# Patient Record
Sex: Female | Born: 1985 | Race: Black or African American | Hispanic: No | Marital: Single | Smoking: Never smoker
Health system: Southern US, Community
[De-identification: ages and names within clinical notes are randomized; demographics above are authoritative.]

---

## 2006-08-16 ENCOUNTER — Emergency Department (HOSPITAL_COMMUNITY): Admission: EM | Admit: 2006-08-16 | Discharge: 2006-08-16 | Payer: Self-pay | Admitting: Emergency Medicine

## 2006-08-16 ENCOUNTER — Ambulatory Visit (HOSPITAL_COMMUNITY): Admission: RE | Admit: 2006-08-16 | Discharge: 2006-08-16 | Payer: Self-pay | Admitting: Emergency Medicine

## 2006-10-03 ENCOUNTER — Emergency Department (HOSPITAL_COMMUNITY): Admission: EM | Admit: 2006-10-03 | Discharge: 2006-10-03 | Payer: Self-pay | Admitting: Family Medicine

## 2006-10-29 ENCOUNTER — Emergency Department (HOSPITAL_COMMUNITY): Admission: EM | Admit: 2006-10-29 | Discharge: 2006-10-29 | Payer: Self-pay | Admitting: Family Medicine

## 2007-01-18 ENCOUNTER — Emergency Department (HOSPITAL_COMMUNITY): Admission: EM | Admit: 2007-01-18 | Discharge: 2007-01-18 | Payer: Self-pay | Admitting: Emergency Medicine

## 2007-01-31 ENCOUNTER — Inpatient Hospital Stay (HOSPITAL_COMMUNITY): Admission: AD | Admit: 2007-01-31 | Discharge: 2007-01-31 | Payer: Self-pay | Admitting: Family Medicine

## 2007-01-31 ENCOUNTER — Ambulatory Visit: Payer: Self-pay | Admitting: Family Medicine

## 2007-03-22 ENCOUNTER — Ambulatory Visit: Payer: Self-pay | Admitting: Certified Nurse Midwife

## 2007-03-22 ENCOUNTER — Inpatient Hospital Stay (HOSPITAL_COMMUNITY): Admission: AD | Admit: 2007-03-22 | Discharge: 2007-03-22 | Payer: Self-pay | Admitting: Obstetrics & Gynecology

## 2007-05-08 ENCOUNTER — Ambulatory Visit: Payer: Self-pay | Admitting: Obstetrics & Gynecology

## 2007-05-08 ENCOUNTER — Encounter: Payer: Self-pay | Admitting: Obstetrics and Gynecology

## 2007-05-22 ENCOUNTER — Ambulatory Visit: Payer: Self-pay | Admitting: Obstetrics & Gynecology

## 2007-06-12 ENCOUNTER — Ambulatory Visit: Payer: Self-pay | Admitting: Obstetrics & Gynecology

## 2007-06-19 ENCOUNTER — Ambulatory Visit: Payer: Self-pay | Admitting: Obstetrics & Gynecology

## 2007-06-24 ENCOUNTER — Inpatient Hospital Stay (HOSPITAL_COMMUNITY): Admission: AD | Admit: 2007-06-24 | Discharge: 2007-06-27 | Payer: Self-pay | Admitting: Family Medicine

## 2007-06-24 ENCOUNTER — Ambulatory Visit: Payer: Self-pay | Admitting: *Deleted

## 2007-06-27 ENCOUNTER — Inpatient Hospital Stay (HOSPITAL_COMMUNITY): Admission: AD | Admit: 2007-06-27 | Discharge: 2007-06-28 | Payer: Self-pay | Admitting: Obstetrics and Gynecology

## 2007-06-27 ENCOUNTER — Ambulatory Visit: Payer: Self-pay | Admitting: Obstetrics and Gynecology

## 2008-06-22 ENCOUNTER — Emergency Department (HOSPITAL_BASED_OUTPATIENT_CLINIC_OR_DEPARTMENT_OTHER): Admission: EM | Admit: 2008-06-22 | Discharge: 2008-06-22 | Payer: Self-pay | Admitting: Emergency Medicine

## 2010-03-28 ENCOUNTER — Emergency Department (HOSPITAL_BASED_OUTPATIENT_CLINIC_OR_DEPARTMENT_OTHER): Admission: EM | Admit: 2010-03-28 | Discharge: 2010-03-28 | Payer: Self-pay | Admitting: Emergency Medicine

## 2010-04-08 ENCOUNTER — Emergency Department (HOSPITAL_BASED_OUTPATIENT_CLINIC_OR_DEPARTMENT_OTHER): Admission: EM | Admit: 2010-04-08 | Discharge: 2010-04-08 | Payer: Self-pay | Admitting: Emergency Medicine

## 2011-01-08 LAB — PREGNANCY, URINE: Preg Test, Ur: NEGATIVE

## 2011-01-23 ENCOUNTER — Emergency Department (HOSPITAL_BASED_OUTPATIENT_CLINIC_OR_DEPARTMENT_OTHER)
Admission: EM | Admit: 2011-01-23 | Discharge: 2011-01-23 | Disposition: A | Discharge status: Home / Self Care | Payer: Medicaid Other | Attending: Emergency Medicine | Admitting: Emergency Medicine

## 2011-01-23 DIAGNOSIS — B354 Tinea corporis: Secondary | ICD-10-CM | POA: Insufficient documentation

## 2011-08-04 LAB — CBC
Hemoglobin: 13.1
MCHC: 34.3
Platelets: 168
RDW: 15.1 — ABNORMAL HIGH

## 2011-08-04 LAB — URINALYSIS, ROUTINE W REFLEX MICROSCOPIC
Glucose, UA: NEGATIVE
Ketones, ur: NEGATIVE
Leukocytes, UA: NEGATIVE
Protein, ur: NEGATIVE
Urobilinogen, UA: 1

## 2011-08-04 LAB — POCT URINALYSIS DIP (DEVICE)
Glucose, UA: NEGATIVE
Hgb urine dipstick: NEGATIVE
Ketones, ur: NEGATIVE
Operator id: 159681
Specific Gravity, Urine: 1.01

## 2011-08-07 LAB — POCT URINALYSIS DIP (DEVICE)
Bilirubin Urine: NEGATIVE
Glucose, UA: NEGATIVE
Glucose, UA: NEGATIVE
Hgb urine dipstick: NEGATIVE
Ketones, ur: NEGATIVE
Ketones, ur: NEGATIVE
Operator id: 11950
Operator id: 134861
Specific Gravity, Urine: 1.02
Specific Gravity, Urine: 1.02

## 2011-11-30 ENCOUNTER — Encounter (HOSPITAL_BASED_OUTPATIENT_CLINIC_OR_DEPARTMENT_OTHER): Payer: Self-pay | Admitting: *Deleted

## 2011-11-30 ENCOUNTER — Emergency Department (HOSPITAL_BASED_OUTPATIENT_CLINIC_OR_DEPARTMENT_OTHER)
Admission: EM | Admit: 2011-11-30 | Discharge: 2011-11-30 | Disposition: A | Discharge status: Home / Self Care | Payer: Self-pay | Attending: Emergency Medicine | Admitting: Emergency Medicine

## 2011-11-30 DIAGNOSIS — J3489 Other specified disorders of nose and nasal sinuses: Secondary | ICD-10-CM | POA: Insufficient documentation

## 2011-11-30 DIAGNOSIS — R51 Headache: Secondary | ICD-10-CM | POA: Insufficient documentation

## 2011-11-30 DIAGNOSIS — J329 Chronic sinusitis, unspecified: Secondary | ICD-10-CM | POA: Insufficient documentation

## 2011-11-30 MED ORDER — IBUPROFEN 800 MG PO TABS: 800.0000 mg | ORAL_TABLET | Freq: Three times a day (TID) | ORAL | Status: AC

## 2011-11-30 MED ORDER — PSEUDOEPHEDRINE HCL ER 120 MG PO TB12: 120.0000 mg | ORAL_TABLET | Freq: Two times a day (BID) | ORAL | Status: AC

## 2011-11-30 MED ORDER — IBUPROFEN 800 MG PO TABS
800.0000 mg | ORAL_TABLET | Freq: Once | ORAL | Status: AC
  Administered 2011-11-30: 800 mg via ORAL
  Filled 2011-11-30: qty 1

## 2011-11-30 NOTE — ED Provider Notes (Signed)
History     CSN: [id number]  Arrival date & time 11/30/11  1401   First MD Initiated Contact with Patient 11/30/11 1411      Chief Complaint  Patient presents with  . Nasal Congestion    (Consider location/radiation/quality/duration/timing/severity/associated sxs/prior treatment) Patient is a 26 y.o. female presenting with URI. The history is provided by the spouse.  URI The primary symptoms include headaches. Primary symptoms do not include fever, sore throat, cough, wheezing, nausea or vomiting. The current episode started 3 to 5 days ago. This is a new problem. The problem has not changed since onset. Symptoms associated with the illness include sinus pressure, congestion and rhinorrhea. The illness is not associated with plugged ear sensation. The following treatments were addressed: Acetaminophen was not tried. A decongestant was not tried. Aspirin was not tried. NSAIDs were not tried.   History reviewed. No pertinent past medical history.  History reviewed. No pertinent past surgical history.  History reviewed. No pertinent family history.  History  Substance Use Topics  . Smoking status: Never Smoker   . Smokeless tobacco: Not on file  . Alcohol Use: Yes    OB History    Grav Para Term Preterm Abortions TAB SAB Ect Mult Living                  Review of Systems  Constitutional: Negative for fever.  HENT: Positive for congestion, rhinorrhea and sinus pressure. Negative for sore throat.   Respiratory: Negative for cough and wheezing.   Gastrointestinal: Negative for nausea and vomiting.  Neurological: Positive for headaches.  All other systems reviewed and are negative.    Allergies  Review of patient's allergies indicates no known allergies.  Home Medications  No current outpatient prescriptions on file.  BP 113/67  Pulse 101  Temp(Src) 99.4 F (37.4 C) (Oral)  Resp 20  SpO2 99%  Physical Exam  Nursing note and vitals reviewed. Constitutional: She  appears well-developed and well-nourished. No distress.  HENT:  Head: Normocephalic and atraumatic.  Right Ear: External ear normal.  Left Ear: External ear normal.  Nose: Mucosal edema and rhinorrhea present. No nose lacerations, sinus tenderness or nasal deformity. Right sinus exhibits no maxillary sinus tenderness and no frontal sinus tenderness. Left sinus exhibits no maxillary sinus tenderness and no frontal sinus tenderness.  Mouth/Throat: Uvula is midline, oropharynx is clear and moist and mucous membranes are normal.  Eyes: Conjunctivae are normal. Right eye exhibits no discharge. Left eye exhibits no discharge. No scleral icterus.  Neck: Neck supple. No tracheal deviation present.  Cardiovascular: Normal rate, regular rhythm and normal heart sounds.   Pulmonary/Chest: Effort normal and breath sounds normal. No stridor. No respiratory distress. She has no wheezes. She has no rales.  Musculoskeletal: She exhibits no edema.  Neurological: She is alert. Cranial nerve deficit: no gross deficits.  Skin: Skin is warm and dry. No rash noted.  Psychiatric: She has a normal mood and affect.    ED Course  Procedures (including critical care time)  Labs Reviewed - No data to display No results found.   1. Rhinosinusitis       MDM  Symptoms are most likely related to a viral infection. Her symptoms are not suggestive of a bacterial sinusitis. Patient be treated with decongestants and NSAIDs. Patient has been instructed to return for worsening symptoms fever or other concerns        Celene Kras, MD 11/30/11 [phone]

## 2011-11-30 NOTE — ED Notes (Signed)
Pt amb to room 6 with quick steady gait in nad. Reports sinus congestion and drainage with sneezing x Monday.

## 2014-09-25 ENCOUNTER — Encounter (HOSPITAL_COMMUNITY): Payer: Self-pay | Admitting: *Deleted

## 2014-09-25 ENCOUNTER — Emergency Department (HOSPITAL_COMMUNITY)
Admission: EM | Admit: 2014-09-25 | Discharge: 2014-09-25 | Disposition: A | Discharge status: Home / Self Care | Payer: Self-pay | Source: Home / Self Care

## 2014-09-25 NOTE — ED Notes (Signed)
Pt  Reports  Symptoms  Of  Nasal  Congestion     With  sorethroat     ru ny nose  And a  Cough   At  This  Time pt  Is  Sitting  Upright on exam  Table  Speaking in   Complete  sentances

## 2014-09-25 NOTE — Discharge Instructions (Signed)
Take  Medication       As  Prescribed  Increase  Fluid  Intake    May  Take     Mucinex   DM        Return    If          Any      furthur      Symptoms

## 2015-05-14 ENCOUNTER — Encounter (HOSPITAL_BASED_OUTPATIENT_CLINIC_OR_DEPARTMENT_OTHER): Payer: Self-pay | Admitting: *Deleted

## 2015-05-14 ENCOUNTER — Emergency Department (HOSPITAL_BASED_OUTPATIENT_CLINIC_OR_DEPARTMENT_OTHER)
Admission: EM | Admit: 2015-05-14 | Discharge: 2015-05-14 | Disposition: A | Discharge status: Home / Self Care | Payer: Self-pay | Attending: Emergency Medicine | Admitting: Emergency Medicine

## 2015-05-14 DIAGNOSIS — Z3202 Encounter for pregnancy test, result negative: Secondary | ICD-10-CM | POA: Insufficient documentation

## 2015-05-14 DIAGNOSIS — K2100 Gastro-esophageal reflux disease with esophagitis, without bleeding: Secondary | ICD-10-CM

## 2015-05-14 DIAGNOSIS — R42 Dizziness and giddiness: Secondary | ICD-10-CM | POA: Insufficient documentation

## 2015-05-14 DIAGNOSIS — K219 Gastro-esophageal reflux disease without esophagitis: Secondary | ICD-10-CM | POA: Insufficient documentation

## 2015-05-14 LAB — URINALYSIS, ROUTINE W REFLEX MICROSCOPIC
Bilirubin Urine: NEGATIVE
Glucose, UA: NEGATIVE mg/dL
HGB URINE DIPSTICK: NEGATIVE
KETONES UR: NEGATIVE mg/dL
Leukocytes, UA: NEGATIVE
Nitrite: NEGATIVE
PROTEIN: NEGATIVE mg/dL
Specific Gravity, Urine: 1.023 (ref 1.005–1.030)
Urobilinogen, UA: 1 mg/dL (ref 0.0–1.0)
pH: 6 (ref 5.0–8.0)

## 2015-05-14 LAB — PREGNANCY, URINE: PREG TEST UR: NEGATIVE

## 2015-05-14 MED ORDER — MECLIZINE HCL 25 MG PO TABS: ORAL_TABLET | ORAL | Status: DC

## 2015-05-14 MED ORDER — OMEPRAZOLE 20 MG PO CPDR: 20.0000 mg | DELAYED_RELEASE_CAPSULE | Freq: Two times a day (BID) | ORAL | Status: DC

## 2015-05-14 MED ORDER — ONDANSETRON 4 MG PO TBDP: 4.0000 mg | ORAL_TABLET | Freq: Three times a day (TID) | ORAL | Status: DC | PRN

## 2015-05-14 NOTE — ED Notes (Signed)
Awoke this am with lower across abd pain and felt dizzy. States she developed a frontal h/a later. States she has had hx of dizziness with not eating. States she has not eaten today.

## 2015-05-14 NOTE — ED Provider Notes (Signed)
CSN: [id number]     Arrival date & time 05/14/15  1015 History   First MD Initiated Contact with Patient 05/14/15 1029     No chief complaint on file.     HPI  Patient presents for evaluation of multiple complaints. States she's had indigestion for "a while". And states "I was thinking it was given have to come here for that anyway". He can this morning with vertigo. When she stands she feels like things are spinning and she gets dizzy. She continues to move she gets nauseated.  No ear pain or pressure. No URI symptoms or allergies. Mild headache this am, not now.   headache. No extremity numbness weakness.     History reviewed. No pertinent past medical history. History reviewed. No pertinent past surgical history. No family history on file. History  Substance Use Topics  . Smoking status: Never Smoker   . Smokeless tobacco: Not on file  . Alcohol Use: Yes   OB History    No data available     Review of Systems  Constitutional: Negative for fever, chills, diaphoresis, appetite change and fatigue.  HENT: Negative for ear pain, mouth sores, sore throat and trouble swallowing.   Eyes: Negative for visual disturbance.  Respiratory: Negative for cough, chest tightness, shortness of breath and wheezing.   Cardiovascular: Negative for chest pain.  Gastrointestinal: Positive for nausea and abdominal pain. Negative for vomiting, diarrhea and abdominal distention.  Endocrine: Negative for polydipsia, polyphagia and polyuria.  Genitourinary: Negative for dysuria, frequency and hematuria.  Musculoskeletal: Negative for gait problem.  Skin: Negative for color change, pallor and rash.  Neurological: Positive for dizziness. Negative for syncope, light-headedness and headaches.  Hematological: Does not bruise/bleed easily.  Psychiatric/Behavioral: Negative for behavioral problems and confusion.      Allergies  Review of patient's allergies indicates no known allergies.  Home  Medications   Prior to Admission medications   Medication Sig Start Date End Date Taking? Authorizing Provider  meclizine (ANTIVERT) 25 MG tablet 4 times a day when necessary until 24 hours without dizziness 05/14/15   Rolland Porter, MD  omeprazole (PRILOSEC) 20 MG capsule Take 1 capsule (20 mg total) by mouth 2 (two) times daily. 05/14/15   Rolland Porter, MD  ondansetron (ZOFRAN ODT) 4 MG disintegrating tablet Take 1 tablet (4 mg total) by mouth every 8 (eight) hours as needed for nausea. 05/14/15   Rolland Porter, MD   BP 112/68 mmHg  Pulse 79  Temp(Src) 98.2 F (36.8 C) (Oral)  Resp 16  Ht  (1.727 m)  Wt 215 lb (97.523 kg)  BMI 32.70 kg/m2  SpO2 99% Physical Exam  Constitutional: She is oriented to person, place, and time. She appears well-developed and well-nourished. No distress.  HENT:  Head: Normocephalic.  Eyes: Conjunctivae are normal. Pupils are equal, round, and reactive to light. No scleral icterus.  Neck: Normal range of motion. Neck supple. No thyromegaly present.  Cardiovascular: Normal rate and regular rhythm.  Exam reveals no gallop and no friction rub.   No murmur heard. Pulmonary/Chest: Effort normal and breath sounds normal. No respiratory distress. She has no wheezes. She has no rales.  Abdominal: Soft. Bowel sounds are normal. She exhibits no distension. There is no tenderness. There is no rebound.  Soft benign abdomen.   Musculoskeletal: Normal range of motion.  Neurological: She is alert and oriented to person, place, and time.  Intact cranial nerves. Nystagmus with lateral gaze. Traumatic with sitting upright. Is able to  ambulate.  Skin: Skin is warm and dry. No rash noted.  Psychiatric: She has a normal mood and affect. Her behavior is normal.    ED Course  Procedures (including critical care time) Labs Review Labs Reviewed  PREGNANCY, URINE  URINALYSIS, ROUTINE W REFLEX MICROSCOPIC (NOT AT [facility])    Imaging Review No results found.   EKG  Interpretation None      MDM   Final diagnoses:  Vertigo  Gastroesophageal reflux disease with esophagitis    Multiple symptoms. Benign exam. Not pregnant. Plan is symptomatic treatment for vertigo. Reflux precautions. Prilosec. Avoid alcohol tobacco anti-inflammatory caffeine. Needs a primary care physician.    Rolland Porter, MD 05/14/15 1134

## 2015-05-14 NOTE — Discharge Instructions (Signed)
Benign Positional Vertigo Vertigo means you feel like you or your surroundings are moving when they are not. Benign positional vertigo is the most common form of vertigo. Benign means that the cause of your condition is not serious. Benign positional vertigo is more common in older adults. CAUSES  Benign positional vertigo is the result of an upset in the labyrinth system. This is an area in the middle ear that helps control your balance. This may be caused by a viral infection, head injury, or repetitive motion. However, often no specific cause is found. SYMPTOMS  Symptoms of benign positional vertigo occur when you move your head or eyes in different directions. Some of the symptoms may include:  Loss of balance and falls.  Vomiting.  Blurred vision.  Dizziness.  Nausea.  Involuntary eye movements (nystagmus). DIAGNOSIS  Benign positional vertigo is usually diagnosed by physical exam. If the specific cause of your benign positional vertigo is unknown, your caregiver may perform imaging tests, such as magnetic resonance imaging (MRI) or computed tomography (CT). TREATMENT  Your caregiver may recommend movements or procedures to correct the benign positional vertigo. Medicines such as meclizine, benzodiazepines, and medicines for nausea may be used to treat your symptoms. In rare cases, if your symptoms are caused by certain conditions that affect the inner ear, you may need surgery. HOME CARE INSTRUCTIONS   Follow your caregiver's instructions.  Move slowly. Do not make sudden body or head movements.  Avoid driving.  Avoid operating heavy machinery.  Avoid performing any tasks that would be dangerous to you or others during a vertigo episode.  Drink enough fluids to keep your urine clear or pale yellow. SEEK IMMEDIATE MEDICAL CARE IF:   You develop problems with walking, weakness, numbness, or using your arms, hands, or legs.  You have difficulty speaking.  You develop  severe headaches.  Your nausea or vomiting continues or gets worse.  You develop visual changes.  Your family or friends notice any behavioral changes.  Your condition gets worse.  You have a fever.  You develop a stiff neck or sensitivity to light. MAKE SURE YOU:   Understand these instructions.  Will watch your condition.  Will get help right away if you are not doing well or get worse. Document Released: 07/17/2006 Document Revised: 01/01/2012 Document Reviewed: 06/29/2011 [facility] Patient Information 2015 [place], [place]. This information is not intended to replace advice given to you by your health care provider. Make sure you discuss any questions you have with your health care provider.  Gastroesophageal Reflux Disease, Adult Gastroesophageal reflux disease (GERD) happens when acid from your stomach goes into your food pipe (esophagus). The acid can cause a burning feeling in your chest. Over time, the acid can make small holes (ulcers) in your food pipe.  HOME CARE  Ask your doctor for advice about:  Losing weight.  Quitting smoking.  Alcohol use.  Avoid foods and drinks that make your problems worse. You may want to avoid:  Caffeine and alcohol.  Chocolate.  Mints.  Garlic and onions.  Spicy foods.  Citrus fruits, such as oranges, lemons, or limes.  Foods that contain tomato, such as sauce, chili, salsa, and pizza.  Fried and fatty foods.  Avoid lying down for 3 hours before you go to bed or before you take a nap.  Eat small meals often, instead of large meals.  Wear loose-fitting clothing. Do not wear anything tight around your waist.  Raise (elevate) the head of your bed 6  to 8 inches with wood blocks. Using extra pillows does not help.  Only take medicines as told by your doctor.  Do not take aspirin or ibuprofen. GET HELP RIGHT AWAY IF:   You have pain in your arms, neck, jaw, teeth, or back.  Your pain gets worse or changes.  You  feel sick to your stomach (nauseous), throw up (vomit), or sweat (diaphoresis).  You feel short of breath, or you pass out (faint).  Your throw up is green, yellow, black, or looks like coffee grounds or blood.  Your poop (stool) is red, bloody, or black. MAKE SURE YOU:   Understand these instructions.  Will watch your condition.  Will get help right away if you are not doing well or get worse. Document Released: 03/27/2008 Document Revised: 01/01/2012 Document Reviewed: 04/28/2011 [facility] Patient Information 2015 [place], [place]. This information is not intended to replace advice given to you by your health care provider. Make sure you discuss any questions you have with your health care provider.

## 2015-09-15 ENCOUNTER — Encounter (HOSPITAL_BASED_OUTPATIENT_CLINIC_OR_DEPARTMENT_OTHER): Payer: Self-pay

## 2015-09-15 ENCOUNTER — Emergency Department (HOSPITAL_BASED_OUTPATIENT_CLINIC_OR_DEPARTMENT_OTHER)
Admission: EM | Admit: 2015-09-15 | Discharge: 2015-09-15 | Disposition: A | Discharge status: Home / Self Care | Payer: Self-pay | Attending: Emergency Medicine | Admitting: Emergency Medicine

## 2015-09-15 DIAGNOSIS — J069 Acute upper respiratory infection, unspecified: Secondary | ICD-10-CM | POA: Insufficient documentation

## 2015-09-15 DIAGNOSIS — Z79899 Other long term (current) drug therapy: Secondary | ICD-10-CM | POA: Insufficient documentation

## 2015-09-15 MED ORDER — DEXAMETHASONE 4 MG PO TABS
12.0000 mg | ORAL_TABLET | Freq: Once | ORAL | Status: AC
  Administered 2015-09-15: 12 mg via ORAL
  Filled 2015-09-15: qty 3

## 2015-09-15 NOTE — ED Notes (Signed)
Reports cough, chest congestion and hoarseness since 2 days ago.  Denies fever.

## 2015-09-15 NOTE — Discharge Instructions (Signed)
Upper Respiratory Infection, Adult Most upper respiratory infections (URIs) are a viral infection of the air passages leading to the lungs. A URI affects the nose, throat, and upper air passages. The most common type of URI is nasopharyngitis and is typically referred to as "the common cold." URIs run their course and usually go away on their own. Most of the time, a URI does not require medical attention, but sometimes a bacterial infection in the upper airways can follow a viral infection. This is called a secondary infection. Sinus and middle ear infections are common types of secondary upper respiratory infections. Bacterial pneumonia can also complicate a URI. A URI can worsen asthma and chronic obstructive pulmonary disease (COPD). Sometimes, these complications can require emergency medical care and may be life threatening.  CAUSES Almost all URIs are caused by viruses. A virus is a type of germ and can spread from one person to another.  RISKS FACTORS You may be at risk for a URI if:   You smoke.   You have chronic heart or lung disease.  You have a weakened defense (immune) system.   You are very young or very old.   You have nasal allergies or asthma.  You work in crowded or poorly ventilated areas.  You work in health care facilities or schools. SIGNS AND SYMPTOMS  Symptoms typically develop 2-3 days after you come in contact with a cold virus. Most viral URIs last 7-10 days. However, viral URIs from the influenza virus (flu virus) can last 14-18 days and are typically more severe. Symptoms may include:   Runny or stuffy (congested) nose.   Sneezing.   Cough.   Sore throat.   Headache.   Fatigue.   Fever.   Loss of appetite.   Pain in your forehead, behind your eyes, and over your cheekbones (sinus pain).  Muscle aches.  DIAGNOSIS  Your health care provider may diagnose a URI by:  Physical exam.  Tests to check that your symptoms are not due to  another condition such as:  Strep throat.  Sinusitis.  Pneumonia.  Asthma. TREATMENT  A URI goes away on its own with time. It cannot be cured with medicines, but medicines may be prescribed or recommended to relieve symptoms. Medicines may help:  Reduce your fever.  Reduce your cough.  Relieve nasal congestion. HOME CARE INSTRUCTIONS   Take medicines only as directed by your health care provider.   Gargle warm saltwater or take cough drops to comfort your throat as directed by your health care provider.  Use a warm mist humidifier or inhale steam from a shower to increase air moisture. This may make it easier to breathe.  Drink enough fluid to keep your urine clear or pale yellow.   Eat soups and other clear broths and maintain good nutrition.   Rest as needed.   Return to work when your temperature has returned to normal or as your health care provider advises. You may need to stay home longer to avoid infecting others. You can also use a face mask and careful hand washing to prevent spread of the virus.  Increase the usage of your inhaler if you have asthma.   Do not use any tobacco products, including cigarettes, chewing tobacco, or electronic cigarettes. If you need help quitting, ask your health care provider. PREVENTION  The best way to protect yourself from getting a cold is to practice good hygiene.   Avoid oral or hand contact with people with cold   symptoms.   Wash your hands often if contact occurs.  There is no clear evidence that vitamin C, vitamin E, echinacea, or exercise reduces the chance of developing a cold. However, it is always recommended to get plenty of rest, exercise, and practice good nutrition.  SEEK MEDICAL CARE IF:   You are getting worse rather than better.   Your symptoms are not controlled by medicine.   You have chills.  You have worsening shortness of breath.  You have brown or red mucus.  You have yellow or brown nasal  discharge.  You have pain in your face, especially when you bend forward.  You have a fever.  You have swollen neck glands.  You have pain while swallowing.  You have white areas in the back of your throat. SEEK IMMEDIATE MEDICAL CARE IF:   You have severe or persistent:  Headache.  Ear pain.  Sinus pain.  Chest pain.  You have chronic lung disease and any of the following:  Wheezing.  Prolonged cough.  Coughing up blood.  A change in your usual mucus.  You have a stiff neck.  You have changes in your:  Vision.  Hearing.  Thinking.  Mood. MAKE SURE YOU:   Understand these instructions.  Will watch your condition.  Will get help right away if you are not doing well or get worse.   This information is not intended to replace advice given to you by your health care provider. Make sure you discuss any questions you have with your health care provider.   Document Released: 04/04/2001 Document Revised: 02/23/2015 Document Reviewed: 01/14/2014 Elsevier Interactive Patient Education 2016 [employer]  

## 2015-09-30 NOTE — ED Provider Notes (Signed)
CSN: [id number]     Arrival date & time 09/15/15  1711 History   First MD Initiated Contact with Patient 09/15/15 1724     Chief Complaint  Patient presents with  . Cough     (Consider location/radiation/quality/duration/timing/severity/associated sxs/prior Treatment) HPI   29 year old female with cough, chest congestion and hoarseness. Symptom onset 2 days ago. Persistent since then. Cough is nonproductive. Denies any shortness of breath. No unusual leg pain or swelling. No fevers or chills. Denies any past history of DVT/PE. Has been taking over-the-counter cold medications with minimal relief. Otherwise fairly healthy.   History reviewed. No pertinent past medical history. History reviewed. No pertinent past surgical history. No family history on file. Social History  Substance Use Topics  . Smoking status: Never Smoker   . Smokeless tobacco: None  . Alcohol Use: Yes   OB History    No data available     Review of Systems  All systems reviewed and negative, other than as noted in HPI.   Allergies  Review of patient's allergies indicates no known allergies.  Home Medications   Prior to Admission medications   Medication Sig Start Date End Date Taking? Authorizing Provider  meclizine (ANTIVERT) 25 MG tablet 4 times a day when necessary until 24 hours without dizziness 05/14/15   Rolland PorterMark James, MD  omeprazole (PRILOSEC) 20 MG capsule Take 1 capsule (20 mg total) by mouth 2 (two) times daily. 05/14/15   Rolland PorterMark James, MD  ondansetron (ZOFRAN ODT) 4 MG disintegrating tablet Take 1 tablet (4 mg total) by mouth every 8 (eight) hours as needed for nausea. 05/14/15   Rolland PorterMark James, MD   BP 125/90 mmHg  Pulse 98  Temp(Src) 98.8 F (37.1 C) (Oral)  Resp 16  Ht 5\' 9"  (1.753 m)  Wt 214 lb (97.07 kg)  BMI 31.59 kg/m2  SpO2 100% Physical Exam  Constitutional: She appears well-developed and well-nourished. No distress.  HENT:  Head: Normocephalic and atraumatic.  Eyes: Conjunctivae  are normal. Right eye exhibits no discharge. Left eye exhibits no discharge.  Neck: Neck supple.  Cardiovascular: Normal rate, regular rhythm and normal heart sounds.  Exam reveals no gallop and no friction rub.   No murmur heard. Pulmonary/Chest: Effort normal. No respiratory distress. She has no rales. She exhibits no tenderness.  Faint expiratory wheezing?  Abdominal: Soft. She exhibits no distension. There is no tenderness.  Musculoskeletal: She exhibits no edema or tenderness.  Lower extremities symmetric as compared to each other. No calf tenderness. Negative Homan's. No palpable cords.   Neurological: She is alert.  Skin: Skin is warm and dry.  Psychiatric: She has a normal mood and affect. Her behavior is normal. Thought content normal.  Nursing note and vitals reviewed.   ED Course  Procedures (including critical care time) Labs Review Labs Reviewed - No data to display  Imaging Review No results found. I have personally reviewed and evaluated these images and lab results as part of my medical decision-making.   EKG Interpretation None      MDM   Final diagnoses:  Viral URI    29 year old female with cough, congestion and hoarseness. Suspect viral URI. She is afebrile. Appears comfortable. No increased work of breathing. Oxygen saturations normal on room air. Perhaps some mild expiratory wheezing? Low suspicion for pneumonia or other serious process. Imaging was deferred. Plan symptomatic treatment.    Raeford RazorStephen Marquavis Hannen, MD 09/30/15 [phone]

## 2015-12-31 ENCOUNTER — Emergency Department (HOSPITAL_COMMUNITY)
Admission: EM | Admit: 2015-12-31 | Discharge: 2015-12-31 | Disposition: A | Discharge status: Home / Self Care | Payer: Self-pay | Attending: Emergency Medicine | Admitting: Emergency Medicine

## 2015-12-31 ENCOUNTER — Encounter (HOSPITAL_COMMUNITY): Payer: Self-pay | Admitting: Emergency Medicine

## 2015-12-31 ENCOUNTER — Emergency Department (HOSPITAL_COMMUNITY): Payer: Self-pay

## 2015-12-31 DIAGNOSIS — Y9289 Other specified places as the place of occurrence of the external cause: Secondary | ICD-10-CM | POA: Insufficient documentation

## 2015-12-31 DIAGNOSIS — Z79899 Other long term (current) drug therapy: Secondary | ICD-10-CM | POA: Insufficient documentation

## 2015-12-31 DIAGNOSIS — S93401A Sprain of unspecified ligament of right ankle, initial encounter: Secondary | ICD-10-CM | POA: Insufficient documentation

## 2015-12-31 DIAGNOSIS — X501XXA Overexertion from prolonged static or awkward postures, initial encounter: Secondary | ICD-10-CM | POA: Insufficient documentation

## 2015-12-31 DIAGNOSIS — Y998 Other external cause status: Secondary | ICD-10-CM | POA: Insufficient documentation

## 2015-12-31 DIAGNOSIS — Y9301 Activity, walking, marching and hiking: Secondary | ICD-10-CM | POA: Insufficient documentation

## 2015-12-31 MED ORDER — IBUPROFEN 400 MG PO TABS: 400.0000 mg | ORAL_TABLET | Freq: Four times a day (QID) | ORAL | Status: DC | PRN

## 2015-12-31 NOTE — Discharge Instructions (Signed)
Your exam is reassuring and her x-ray was negative for any broken bones or dislocations. Your symptoms are likely due to an ankle sprain. Where your brace when active. Take Motrin for your swelling and discomfort. Follow-up with your doctor as needed. Return to ED for new or worsening symptoms as we discussed.  Ankle Sprain An ankle sprain is an injury to the strong, fibrous tissues (ligaments) that hold the bones of your ankle joint together.  CAUSES An ankle sprain is usually caused by a fall or by twisting your ankle. Ankle sprains most commonly occur when you step on the outer edge of your foot, and your ankle turns inward. People who participate in sports are more prone to these types of injuries.  SYMPTOMS   Pain in your ankle. The pain may be present at rest or only when you are trying to stand or walk.  Swelling.  Bruising. Bruising may develop immediately or within 1 to 2 days after your injury.  Difficulty standing or walking, particularly when turning corners or changing directions. DIAGNOSIS  Your caregiver will ask you details about your injury and perform a physical exam of your ankle to determine if you have an ankle sprain. During the physical exam, your caregiver will press on and apply pressure to specific areas of your foot and ankle. Your caregiver will try to move your ankle in certain ways. An X-ray exam may be done to be sure a bone was not broken or a ligament did not separate from one of the bones in your ankle (avulsion fracture).  TREATMENT  Certain types of braces can help stabilize your ankle. Your caregiver can make a recommendation for this. Your caregiver may recommend the use of medicine for pain. If your sprain is severe, your caregiver may refer you to a surgeon who helps to restore function to parts of your skeletal system (orthopedist) or a physical therapist. HOME CARE INSTRUCTIONS   Apply ice to your injury for 1-2 days or as directed by your caregiver.  Applying ice helps to reduce inflammation and pain.  Put ice in a plastic bag.  Place a towel between your skin and the bag.  Leave the ice on for 15-20 minutes at a time, every 2 hours while you are awake.  Only take over-the-counter or prescription medicines for pain, discomfort, or fever as directed by your caregiver.  Elevate your injured ankle above the level of your heart as much as possible for 2-3 days.  If your caregiver recommends crutches, use them as instructed. Gradually put weight on the affected ankle. Continue to use crutches or a cane until you can walk without feeling pain in your ankle.  If you have a plaster splint, wear the splint as directed by your caregiver. Do not rest it on anything harder than a pillow for the first 24 hours. Do not put weight on it. Do not get it wet. You may take it off to take a shower or bath.  You may have been given an elastic bandage to wear around your ankle to provide support. If the elastic bandage is too tight (you have numbness or tingling in your foot or your foot becomes cold and blue), adjust the bandage to make it comfortable.  If you have an air splint, you may blow more air into it or let air out to make it more comfortable. You may take your splint off at night and before taking a shower or bath. Wiggle your toes in the  splint several times per day to decrease swelling. SEEK MEDICAL CARE IF:   You have rapidly increasing bruising or swelling.  Your toes feel extremely cold or you lose feeling in your foot.  Your pain is not relieved with medicine. SEEK IMMEDIATE MEDICAL CARE IF:  Your toes are numb or blue.  You have severe pain that is increasing. MAKE SURE YOU:   Understand these instructions.  Will watch your condition.  Will get help right away if you are not doing well or get worse.   This information is not intended to replace advice given to you by your health care provider. Make sure you discuss any  questions you have with your health care provider.   Document Released: 10/09/2005 Document Revised: 10/30/2014 Document Reviewed: 10/21/2011 Elsevier Interactive Patient Education [employer]2016 [employer] Inc.

## 2015-12-31 NOTE — ED Provider Notes (Signed)
CSN: [id number]     Arrival date & time 12/31/15  0800 History  By signing my name below, I, Hayley Thompson, attest that this documentation has been prepared under the direction and in the presence of Joycie Peek, PA-C Electronically Signed: Charline Bills, ED Scribe 12/31/2015 at 9:43 AM.   Chief Complaint  Patient presents with  . Ankle Pain   The history is provided by the patient. No language interpreter was used.   HPI Comments: Hayley Thompson is a 30 y.o. female who presents to the Emergency Department complaining of constant right ankle pain onset 2 days ago. Pt states that she missed a step while walking in heels and twisted her ankle. Pain that is exacerbated with palpation and movement. She reports associated gradually improving swelling to the area. Pt has tried ice and elevation with mild relief. She denies numbness, weakness, knee pain.   History reviewed. No pertinent past medical history. History reviewed. No pertinent past surgical history. No family history on file. Social History  Substance Use Topics  . Smoking status: Never Smoker   . Smokeless tobacco: None  . Alcohol Use: Yes   OB History    No data available     Review of Systems  Musculoskeletal: Positive for joint swelling and arthralgias.  Neurological: Negative for weakness and numbness.  All other systems reviewed and are negative.  Allergies  Review of patient's allergies indicates no known allergies.  Home Medications   Prior to Admission medications   Medication Sig Start Date End Date Taking? Authorizing Provider  ibuprofen (ADVIL,MOTRIN) 400 MG tablet Take 1 tablet (400 mg total) by mouth every 6 (six) hours as needed. 12/31/15   Joycie Peek, PA-C  meclizine (ANTIVERT) 25 MG tablet 4 times a day when necessary until 24 hours without dizziness 05/14/15   Rolland Porter, MD  omeprazole (PRILOSEC) 20 MG capsule Take 1 capsule (20 mg total) by mouth 2 (two) times daily. 05/14/15   Rolland Porter, MD   ondansetron (ZOFRAN ODT) 4 MG disintegrating tablet Take 1 tablet (4 mg total) by mouth every 8 (eight) hours as needed for nausea. 05/14/15   Rolland Porter, MD   BP 104/79 mmHg  Pulse 86  Temp(Src) 98.1 F (36.7 C) (Oral)  Resp 18  Ht  (1.753 m)  Wt 210 lb (95.255 kg)  BMI 31.00 kg/m2  SpO2 98% Physical Exam  Constitutional: She is oriented to person, place, and time. She appears well-developed and well-nourished. No distress.  HENT:  Head: Normocephalic and atraumatic.  Eyes: Conjunctivae and EOM are normal.  Neck: Neck supple. No tracheal deviation present.  Cardiovascular: Normal rate.   Pulmonary/Chest: Effort normal. No respiratory distress.  Musculoskeletal: Normal range of motion.  Mild tenderness to R lateral malleolus. Distal pulses intact with brisk cap refill. No abnormalities or ecchymosis. FROM of R ankle. Full range of motion of right knee with no tenderness. No other abnormalities  Neurological: She is alert and oriented to person, place, and time.  Skin: Skin is warm and dry.  Psychiatric: She has a normal mood and affect. Her behavior is normal.  Nursing note and vitals reviewed.  ED Course  Procedures (including critical care time) DIAGNOSTIC STUDIES: Oxygen Saturation is 98% on RA, normal by my interpretation.    COORDINATION OF CARE: 9:37 AM-Discussed treatment plan which includes XR with pt at bedside and pt agreed to plan.   Labs Review Labs Reviewed - No data to display  Imaging Review Dg Ankle Complete  Right  12/31/2015  CLINICAL DATA:  Fall 2 days ago.  Lateral ankle pain and swelling. EXAM: RIGHT ANKLE - COMPLETE 3+ VIEW COMPARISON:  None. FINDINGS: There is no evidence of fracture, dislocation, or joint effusion. There is no evidence of arthropathy or other focal bone abnormality. Soft tissues are unremarkable. IMPRESSION: Negative. Electronically Signed   By: Marin Robertshristopher  Mattern M.D.   On: 12/31/2015 08:46   I have personally reviewed and  evaluated these images and lab results as part of my medical decision-making.   EKG Interpretation None      MDM   Final diagnoses:  Ankle sprain, right, initial encounter   Fell and rolled ankle on Wednesday. Patient X-Ray negative for obvious fracture or dislocation. Pain managed in ED. Pt advised to follow up with orthopedics if symptoms persist for possibility of missed fracture diagnosis. Patient given brace while in ED, conservative therapy recommended and discussed. Patient will be dc home & is agreeable with above plan.  I personally performed the services described in this documentation, which was scribed in my presence. The recorded information has been reviewed and is accurate.    Joycie PeekBenjamin Elia Nunley, PA-C 12/31/15 1114  Laurence Spatesachel Morgan Little, MD 01/02/16 [phone]

## 2015-12-31 NOTE — ED Notes (Signed)
Pt given work note per PA order for 24 hrs.

## 2015-12-31 NOTE — ED Notes (Signed)
Air Cast applied to right ankle.

## 2015-12-31 NOTE — ED Notes (Signed)
Pt reports that she missed a step while in heels on Wednesday causing pain to right ankle.

## 2016-11-13 ENCOUNTER — Emergency Department (HOSPITAL_BASED_OUTPATIENT_CLINIC_OR_DEPARTMENT_OTHER)
Admission: EM | Admit: 2016-11-13 | Discharge: 2016-11-13 | Disposition: A | Discharge status: Home / Self Care | Payer: Self-pay | Attending: Emergency Medicine | Admitting: Emergency Medicine

## 2016-11-13 ENCOUNTER — Encounter (HOSPITAL_BASED_OUTPATIENT_CLINIC_OR_DEPARTMENT_OTHER): Payer: Self-pay

## 2016-11-13 DIAGNOSIS — J029 Acute pharyngitis, unspecified: Secondary | ICD-10-CM | POA: Insufficient documentation

## 2016-11-13 DIAGNOSIS — R05 Cough: Secondary | ICD-10-CM

## 2016-11-13 DIAGNOSIS — R059 Cough, unspecified: Secondary | ICD-10-CM

## 2016-11-13 LAB — RAPID STREP SCREEN (MED CTR MEBANE ONLY): Streptococcus, Group A Screen (Direct): NEGATIVE

## 2016-11-13 MED ORDER — IBUPROFEN 800 MG PO TABS: 800.0000 mg | ORAL_TABLET | Freq: Three times a day (TID) | ORAL | 0 refills | Status: DC

## 2016-11-13 MED ORDER — IBUPROFEN 800 MG PO TABS
800.0000 mg | ORAL_TABLET | Freq: Once | ORAL | Status: AC
  Administered 2016-11-13: 800 mg via ORAL
  Filled 2016-11-13: qty 1

## 2016-11-13 MED ORDER — BENZONATATE 100 MG PO CAPS: 100.0000 mg | ORAL_CAPSULE | Freq: Three times a day (TID) | ORAL | 0 refills | Status: DC

## 2016-11-13 NOTE — ED Provider Notes (Signed)
[facility] Note   CSN: [id number] Arrival date & time: 11/13/16  1829  By signing my name below, I, Linna DarnerRussell Turner, attest that this documentation has been prepared under the direction and in the presence of Emerson Electriclexandra Tiyonna Sardinha, PA-C. Electronically Signed: Linna Darnerussell Turner, Scribe. 11/13/2016. 8:42 PM.  History   Chief Complaint Chief Complaint  Patient presents with  . Sore Throat    The history is provided by the patient. No language interpreter was used.     HPI Comments: Hayley Thompson is a 31 y.o. female who presents to the Emergency Department complaining of a constant, gradually worsening sore throat beginning yesterday. She reports associated rhinorrhea and a non-productive cough. Pt endorses severe exacerbation of her sore throat with swallowing. She has tried Dayquil and Nyquil with minimal improvement of her symptoms. 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 She denies fever, chills, ear pain, CP, SOB, abdominal pain, nausea, vomiting, dysuria, drooling, trismus, or any other associated symptoms.  History reviewed. No pertinent past medical history.  There are no active problems to display for this patient.   History reviewed. No pertinent surgical history.  OB History    No data available       Home Medications    Prior to Admission medications   Not on File    Family History No family history on file.  Social History Social History  Substance Use Topics  . Smoking status: Never Smoker  . Smokeless tobacco: Never Used  . Alcohol use No     Allergies   Patient has no known allergies.   Review of Systems Review of Systems  Constitutional: Negative for chills and fever.  HENT: Positive for rhinorrhea and sore throat. Negative for drooling and ear pain.   Respiratory: Positive for cough. Negative for shortness of breath.   Cardiovascular: Negative for chest pain.  Gastrointestinal: Negative for abdominal pain,  nausea and vomiting.  Genitourinary: Negative for dysuria.  Skin: Negative for rash and wound.  Psychiatric/Behavioral: The patient is not nervous/anxious.     Physical Exam Updated Vital Signs BP 120/77 (BP Location: Left Arm)   Pulse 89   Temp 98.7 F (37.1 C) (Oral)   Resp 16   Ht 5\' 8"  (1.727 m)   Wt 93.9 kg   SpO2 98%   BMI 31.47 kg/m   Physical Exam  Constitutional: She appears well-developed and well-nourished. No distress.  HENT:  Head: Normocephalic and atraumatic.  Right Ear: Tympanic membrane normal.  Left Ear: Tympanic membrane normal.  Mouth/Throat: Posterior oropharyngeal edema and posterior oropharyngeal erythema present. No oropharyngeal exudate.  Mild edema.  Eyes: Conjunctivae are normal. Pupils are equal, round, and reactive to light. Right eye exhibits no discharge. Left eye exhibits no discharge. No scleral icterus.  Neck: Normal range of motion. Neck supple. No thyromegaly present.  Cardiovascular: Normal rate, regular rhythm, normal heart sounds and intact distal pulses.  Exam reveals no gallop and no friction rub.   No murmur heard. Pulmonary/Chest: Effort normal and breath sounds normal. No stridor. No respiratory distress. She has no wheezes. She has no rales.  Abdominal: Soft. Bowel sounds are normal. She exhibits no distension. There is no tenderness. There is no rebound and no guarding.  Musculoskeletal: She exhibits no edema.  Lymphadenopathy:    She has no cervical adenopathy.  Neurological: She is alert. Coordination normal.  Skin: Skin is warm and dry. No rash noted. She is not diaphoretic. No pallor.  Psychiatric: She has a normal mood and affect.  Nursing note and vitals reviewed.    ED Treatments / Results  Labs (all labs ordered are listed, but only abnormal results are displayed) Labs Reviewed  RAPID STREP SCREEN (NOT AT [facility])  CULTURE, GROUP A STREP [facility])    EKG  EKG Interpretation None       Radiology No results  found.  Procedures Procedures (including critical care time)  DIAGNOSTIC STUDIES: Oxygen Saturation is 98% on RA, normal by my interpretation.    COORDINATION OF CARE: 8:49 PM Discussed treatment plan with pt at bedside and pt agreed to plan.  Medications Ordered in ED Medications  ibuprofen (ADVIL,MOTRIN) tablet 800 mg (not administered)     Initial Impression / Assessment and Plan / ED Course  I have reviewed the triage vital signs and the nursing notes.  Pertinent labs & imaging results that were available during my care of the patient were reviewed by me and considered in my medical decision making (see chart for details).     Pt symptoms consistent with URI. Rapid strep negative. Culture sent. Patient advised she'll be called if antibiotic is required. Pt will be discharged with symptomatic treatment.  Discussed strict return precautions. Patient understands and agrees with plan. Patient vitals stable throughout ED course and discharged in satisfactory condition. Pt is hemodynamically stable & in NAD prior to discharge.  Final Clinical Impressions(s) / ED Diagnoses   Final diagnoses:  Viral pharyngitis  Cough    New Prescriptions New Prescriptions   No medications on file   I personally performed the services described in this documentation, which was scribed in my presence. The recorded information has been reviewed and is accurate.    Emi Holes, PA-C 11/13/16 1610    Gwyneth Sprout, MD 11/16/16 2201

## 2016-11-13 NOTE — Discharge Instructions (Signed)
Medications: Ibuprofen, Tessalon  Treatment: Take ibuprofen every 8 hours as needed for your throat pain. You can alternate with Tylenol as prescribed over-the-counter. Take Tessalon every 8 hours as needed for cough. Patient to drink plenty of fluids. Gargle with warm salt water 2-3 times daily. You can also find a Chloraseptic spray over-the-counter for your throat pain.  Follow-up: Please return to the emergency department if you develop any asymmetry in her throat, inability to open your mouth, mass in your neck, or any other new or concerning symptoms.

## 2016-11-13 NOTE — ED Triage Notes (Signed)
Sore throat x 2 days-NAD-steady gait

## 2016-11-14 ENCOUNTER — Encounter (HOSPITAL_BASED_OUTPATIENT_CLINIC_OR_DEPARTMENT_OTHER): Payer: Self-pay | Admitting: *Deleted

## 2016-11-14 ENCOUNTER — Emergency Department (HOSPITAL_BASED_OUTPATIENT_CLINIC_OR_DEPARTMENT_OTHER): Payer: Self-pay

## 2016-11-14 ENCOUNTER — Emergency Department (HOSPITAL_BASED_OUTPATIENT_CLINIC_OR_DEPARTMENT_OTHER)
Admission: EM | Admit: 2016-11-14 | Discharge: 2016-11-14 | Disposition: A | Discharge status: Home / Self Care | Payer: Self-pay | Attending: Emergency Medicine | Admitting: Emergency Medicine

## 2016-11-14 DIAGNOSIS — W25XXXA Contact with sharp glass, initial encounter: Secondary | ICD-10-CM | POA: Insufficient documentation

## 2016-11-14 DIAGNOSIS — S61411A Laceration without foreign body of right hand, initial encounter: Secondary | ICD-10-CM | POA: Insufficient documentation

## 2016-11-14 DIAGNOSIS — Y999 Unspecified external cause status: Secondary | ICD-10-CM | POA: Insufficient documentation

## 2016-11-14 DIAGNOSIS — Y92010 Kitchen of single-family (private) house as the place of occurrence of the external cause: Secondary | ICD-10-CM | POA: Insufficient documentation

## 2016-11-14 DIAGNOSIS — Y9389 Activity, other specified: Secondary | ICD-10-CM | POA: Insufficient documentation

## 2016-11-14 MED ORDER — LIDOCAINE HCL (PF) 1 % IJ SOLN
INTRAMUSCULAR | Status: AC
  Filled 2016-11-14: qty 5

## 2016-11-14 NOTE — Discharge Instructions (Signed)
Please read and follow all provided instructions.  Your diagnoses today include:  1. Laceration of right hand without foreign body, initial encounter     Tests performed today include: X-ray of the affected area that did not show any foreign bodies or broken bones Vital signs. See below for your results today.   Medications prescribed:   Take any prescribed medications only as directed.   Home care instructions:  Follow any educational materials and wound care instructions contained in this packet.   You may shower and wash the area with soap and water, just be sure to pat the area dry and not rub over the stitches. Do no put your stiches underwater (in a bath, pool, or lake). Getting stiches wet can slow down healing and increase your chances of getting an infection. You may apply Bacitracin or Neosporin twice a day for 7 days, and keep the ara clean with  bandage or gauze. Do not apply alcohol or hydrogen peroxide. Cover the area if it draining or weeping.   Follow-up instructions: Suture Removal: Return to the Emergency Department or see your primary care care doctor in 8-10 days for a recheck of your wound and removal of your sutures or staples.    Return instructions:  Return to the Emergency Department if you have: Fever Worsening pain Worsening swelling of the wound Pus draining from the wound Redness of the skin that moves away from the wound, especially if it streaks away from the affected area  Any other emergent concerns  Your vital signs today were: BP 115/62 (BP Location: Right Arm)    Pulse 81    Temp 98.3 F (36.8 C) (Oral)    Resp 18    Ht 5\' 8"  (1.727 m)    Wt 93 kg    SpO2 99%    BMI 31.17 kg/m  If your blood pressure (BP) was elevated above 135/85 this visit, please have this repeated by your doctor within one month. --------------

## 2016-11-14 NOTE — ED Triage Notes (Signed)
Pt c/o lac to right hand by broken glass x 3 hrs ago

## 2016-11-14 NOTE — ED Provider Notes (Signed)
[facility]   CSN: [id number] Arrival date & time: 11/14/16  1913   By signing my name below, I, Soijett Blue, attest that this documentation has been prepared under the direction and in the presence of Audry Pili, PA-C Electronically Signed: Soijett Blue, ED Scribe. 11/14/16. 9:55 PM.  History   Chief Complaint Chief Complaint  Patient presents with  . Laceration    HPI Hayley Thompson is a 31 y.o. female who presents to the Emergency Department complaining of laceration to right hand onset 4 hours ago. Pt notes that she was washing dishes when she accidentally cut her right hand with a broken wine glass. She is having associated symptoms of right hand pain. She has tried applying pressure without medications for the relief of her symptoms. She denies color change, swelling, and any other symptoms.    The history is provided by the patient. No language interpreter was used.    History reviewed. No pertinent past medical history.  There are no active problems to display for this patient.   History reviewed. No pertinent surgical history.  OB History    No data available       Home Medications    Prior to Admission medications   Medication Sig Start Date End Date Taking? Authorizing Provider  benzonatate (TESSALON) 100 MG capsule Take 1 capsule (100 mg total) by mouth every 8 (eight) hours. 11/13/16   Emi Holes, PA-C  ibuprofen (ADVIL,MOTRIN) 800 MG tablet Take 1 tablet (800 mg total) by mouth 3 (three) times daily. 11/13/16   Emi Holes, PA-C    Family History History reviewed. No pertinent family history.  Social History Social History  Substance Use Topics  . Smoking status: Never Smoker  . Smokeless tobacco: Never Used  . Alcohol use No     Allergies   Patient has no known allergies.   Review of Systems Review of Systems  Musculoskeletal: Positive for arthralgias (right hand). Negative for joint swelling.  Skin:  Positive for wound (laceration to right hand). Negative for color change.   A complete 10 system review of systems was obtained and all systems are negative except as noted in the HPI and PMH.   Physical Exam Updated Vital Signs BP 115/62 (BP Location: Right Arm)   Pulse 81   Temp 98.3 F (36.8 C) (Oral)   Resp 18   Ht 5\' 8"  (1.727 m)   Wt 205 lb (93 kg)   SpO2 99%   BMI 31.17 kg/m   Physical Exam  Constitutional: She is oriented to person, place, and time. She appears well-developed and well-nourished. No distress.  HENT:  Head: Normocephalic and atraumatic.  Eyes: EOM are normal.  Neck: Neck supple.  Cardiovascular: Normal rate.   Pulmonary/Chest: Effort normal. No respiratory distress.  Abdominal: She exhibits no distension.  Musculoskeletal: Normal range of motion.       Right hand: She exhibits laceration. She exhibits normal range of motion. Normal sensation noted. Normal strength noted.  Right hand with 1.5 cm horseshoe shaped superficial laceration to second DIP. ROM intact. Motor and sensation intact. Bleeding controlled. Bottom of wound visualized.   Neurological: She is alert and oriented to person, place, and time.  Skin: Skin is warm and dry.  Psychiatric: She has a normal mood and affect. Her behavior is normal.  Nursing note and vitals reviewed.  ED Treatments / Results  DIAGNOSTIC STUDIES: Oxygen Saturation is 99% on RA, nl by my interpretation.  COORDINATION OF CARE: 9:51 PM Discussed treatment plan with pt at bedside which includes laceration repair and pt agreed to plan.   Radiology Dg Hand Complete Right  Result Date: 11/14/2016 CLINICAL DATA:  Right hand laceration by glass tonight. Initial encounter. EXAM: RIGHT HAND - COMPLETE 3+ VIEW COMPARISON:  None. FINDINGS: There is no evidence of fracture or dislocation. There is no evidence of arthropathy or other focal bone abnormality. No evidence of radiopaque foreign body. IMPRESSION: Negative.  Electronically Signed   By: Myles Rosenthal M.D.   On: 11/14/2016 21:39    Procedures .Marland KitchenLaceration Repair Date/Time: 11/14/2016 10:12 PM Performed by: Audry Pili Authorized by: Audry Pili   Consent:    Consent obtained:  Verbal   Consent given by:  Patient   Risks discussed:  Pain and need for additional repair Anesthesia (see MAR for exact dosages):    Anesthesia method:  Local infiltration   Local anesthetic:  Lidocaine 1% w/o epi Laceration details:    Location:  Hand   Hand location:  R hand, dorsum   Length (cm):  1.5 Repair type:    Repair type:  Simple Pre-procedure details:    Preparation:  Patient was prepped and draped in usual sterile fashion Exploration:    Hemostasis achieved with:  Direct pressure   Wound exploration: wound explored through full range of motion and entire depth of wound probed and visualized     Wound extent: no foreign bodies/material noted     Contaminated: no   Treatment:    Area cleansed with:  Saline   Amount of cleaning:  Standard   Irrigation solution:  Sterile saline   Irrigation method:  Syringe   Visualized foreign bodies/material removed: no   Skin repair:    Repair method:  Sutures   Suture size:  4-0   Suture material:  Prolene   Suture technique:  Simple interrupted   Number of sutures:  3 Approximation:    Approximation:  Close Post-procedure details:    Dressing:  Adhesive bandage and sterile dressing   Patient tolerance of procedure:  Tolerated well, no immediate complications    (including critical care time)  Medications Ordered in ED Medications - No data to display   Initial Impression / Assessment and Plan / ED Course  I have reviewed the triage vital signs and the nursing notes.  Pertinent imaging results that were available during my care of the patient were reviewed by me and considered in my medical decision making (see chart for details).  I have reviewed and evaluated the relevant imaging studies. I  have reviewed the relevant previous healthcare records. I obtained HPI from historian.  ED Course:  Assessment: Patient is a 31 year old female that presents with laceration to right hand. Tdap booster UTD. Pressure irrigation performed. Bottom of the wound visualized with bleeding controled. Laceration occurred < 8 hours prior to repair which was well tolerated. Pt has no co morbidities to effect normal wound healing. Discussed suture home care w pt and answered questions. Pt to f-u for wound check and suture removal in 8-10 days. Pt is hemodynamically stable w no complaints prior to dc.     Disposition/Plan:  DC home Additional Verbal discharge instructions given and discussed with patient.  Pt Instructed to f/u with PCP in the next week for evaluation and treatment of symptoms. Return precautions given Pt acknowledges and agrees with plan  Supervising Physician Canary Brim Tegeler, MD  Final Clinical Impressions(s) / ED Diagnoses  Final diagnoses:  Laceration of right hand without foreign body, initial encounter    New Prescriptions New Prescriptions   No medications on file    I personally performed the services described in this documentation, which was scribed in my presence. The recorded information has been reviewed and is accurate.    Audry Piliyler Dahlia Nifong, PA-C 11/14/16 2225    Canary Brimhristopher J Tegeler, MD 11/15/16 [phone]

## 2016-11-14 NOTE — ED Notes (Signed)
ED Provider at bedside. 

## 2016-11-16 LAB — CULTURE, GROUP A STREP (THRC)

## 2016-11-23 ENCOUNTER — Emergency Department (HOSPITAL_BASED_OUTPATIENT_CLINIC_OR_DEPARTMENT_OTHER)
Admission: EM | Admit: 2016-11-23 | Discharge: 2016-11-23 | Disposition: A | Discharge status: Home / Self Care | Payer: Self-pay | Attending: Emergency Medicine | Admitting: Emergency Medicine

## 2016-11-23 ENCOUNTER — Encounter (HOSPITAL_BASED_OUTPATIENT_CLINIC_OR_DEPARTMENT_OTHER): Payer: Self-pay

## 2016-11-23 DIAGNOSIS — J3489 Other specified disorders of nose and nasal sinuses: Secondary | ICD-10-CM | POA: Insufficient documentation

## 2016-11-23 DIAGNOSIS — Z4802 Encounter for removal of sutures: Secondary | ICD-10-CM

## 2016-11-23 NOTE — ED Notes (Signed)
ED Provider at bedside. 

## 2016-11-23 NOTE — ED Triage Notes (Signed)
For suture removal right hand-NAD-steady gait

## 2016-11-23 NOTE — ED Provider Notes (Signed)
[facility]   CSN: [id number] Arrival date & time: 11/23/16  2143  By signing my name below, I, Linna Darner, attest that this documentation has been prepared under the direction and in the presence of physician practitioner, Rolan Bucco, MD. Electronically Signed: Linna Darner, Scribe. 11/23/2016. 10:47 PM.  History   Chief Complaint Chief Complaint  Patient presents with  . Suture / Staple Removal    The history is provided by the patient. No language interpreter was used.     HPI Comments: Hayley Thompson is a 31 y.o. female who presents to the Emergency Department for suture removal. She lacerated her right hand on 1/23 with a broken wine glass and had a laceration repair performed here immediately afterwards. She had x-rays at that time which were negative for foreign body. Her tetanus is reportedly up-to-date. She was instructed to follow up for wound check and suture removal in 8-10 days. Pt notes the wound has been healing well. No numbness or any other complaints at this time.  History reviewed. No pertinent past medical history.  There are no active problems to display for this patient.   History reviewed. No pertinent surgical history.  OB History    No data available       Home Medications    Prior to Admission medications   Medication Sig Start Date End Date Taking? Authorizing Provider  benzonatate (TESSALON) 100 MG capsule Take 1 capsule (100 mg total) by mouth every 8 (eight) hours. 11/13/16   Emi Holes, PA-C  ibuprofen (ADVIL,MOTRIN) 800 MG tablet Take 1 tablet (800 mg total) by mouth 3 (three) times daily. 11/13/16   Emi Holes, PA-C    Family History No family history on file.  Social History Social History  Substance Use Topics  . Smoking status: Never Smoker  . Smokeless tobacco: Never Used  . Alcohol use No     Allergies   Patient has no known allergies.   Review of Systems Review of Systems    Constitutional: Negative for fever.  HENT: Positive for rhinorrhea.   Musculoskeletal: Negative for arthralgias and joint swelling.  Skin: Positive for wound.  Neurological: Negative for weakness and numbness.     Physical Exam Updated Vital Signs BP 131/90 (BP Location: Left Arm)   Pulse 75   Temp 98.5 F (36.9 C) (Oral)   Resp 18   Ht 5\' 9"  (1.753 m)   Wt 206 lb (93.4 kg)   SpO2 99%   BMI 30.42 kg/m   Physical Exam  Constitutional: She is oriented to person, place, and time.  Cardiovascular: Normal rate.   Pulmonary/Chest: Effort normal.  Neurological: She is alert and oriented to person, place, and time.  Skin: Skin is warm and dry.  Healing laceration over the dorsal surface of the right hand. Edges are well approximated. There is no signs of infection.     ED Treatments / Results  Labs (all labs ordered are listed, but only abnormal results are displayed) Labs Reviewed - No data to display  EKG  EKG Interpretation None       Radiology No results found.  Procedures Procedures (including critical care time)  DIAGNOSTIC STUDIES: Oxygen Saturation is 99% on RA, normal by my interpretation.    COORDINATION OF CARE: 10:50 PM Discussed treatment plan with pt at bedside and pt agreed to plan.  Medications Ordered in ED Medications - No data to display   Initial Impression / Assessment and Plan /  ED Course  I have reviewed the triage vital signs and the nursing notes.  Pertinent labs & imaging results that were available during my care of the patient were reviewed by me and considered in my medical decision making (see chart for details).     Patient sutures were removed in the ED. Steri-Strips replaced. She was given ongoing wound care instructions and return precautions.  Final Clinical Impressions(s) / ED Diagnoses   Final diagnoses:  Visit for suture removal    New Prescriptions New Prescriptions   No medications on file   I personally  performed the services described in this documentation, which was scribed in my presence.  The recorded information has been reviewed and considered.    Rolan BuccoMelanie Parilee Hally, MD 11/23/16 [phone]

## 2016-12-31 IMAGING — CR DG ANKLE COMPLETE 3+V*R*
3 series · 3 of 3 positions shown · non-contrast
Comparison: None.

CLINICAL DATA: Fall 2 days ago.  Lateral ankle pain and swelling.

EXAM:
RIGHT ANKLE - COMPLETE 3+ VIEW

[ankle ap]
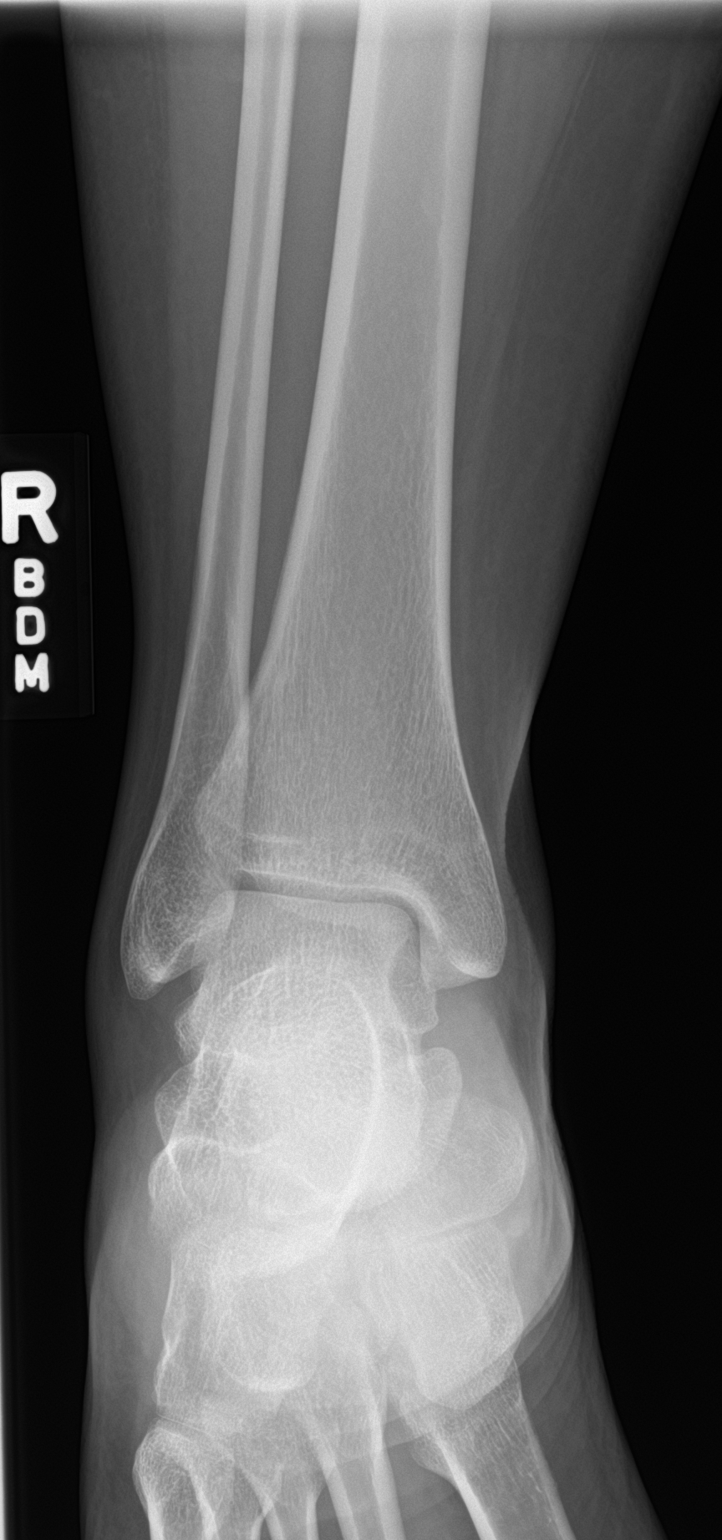

[ankle obl]
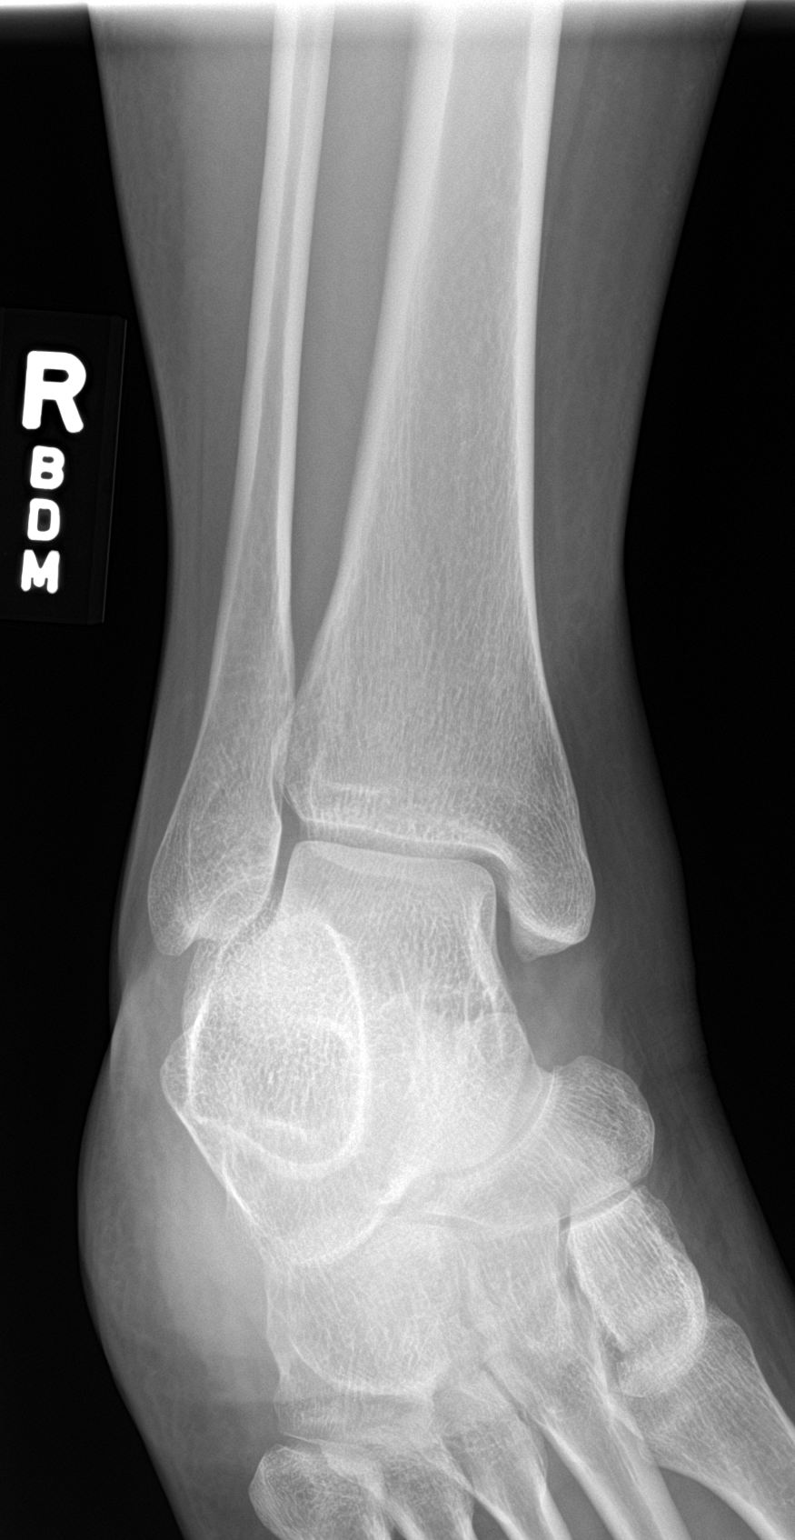

[ankle lat]
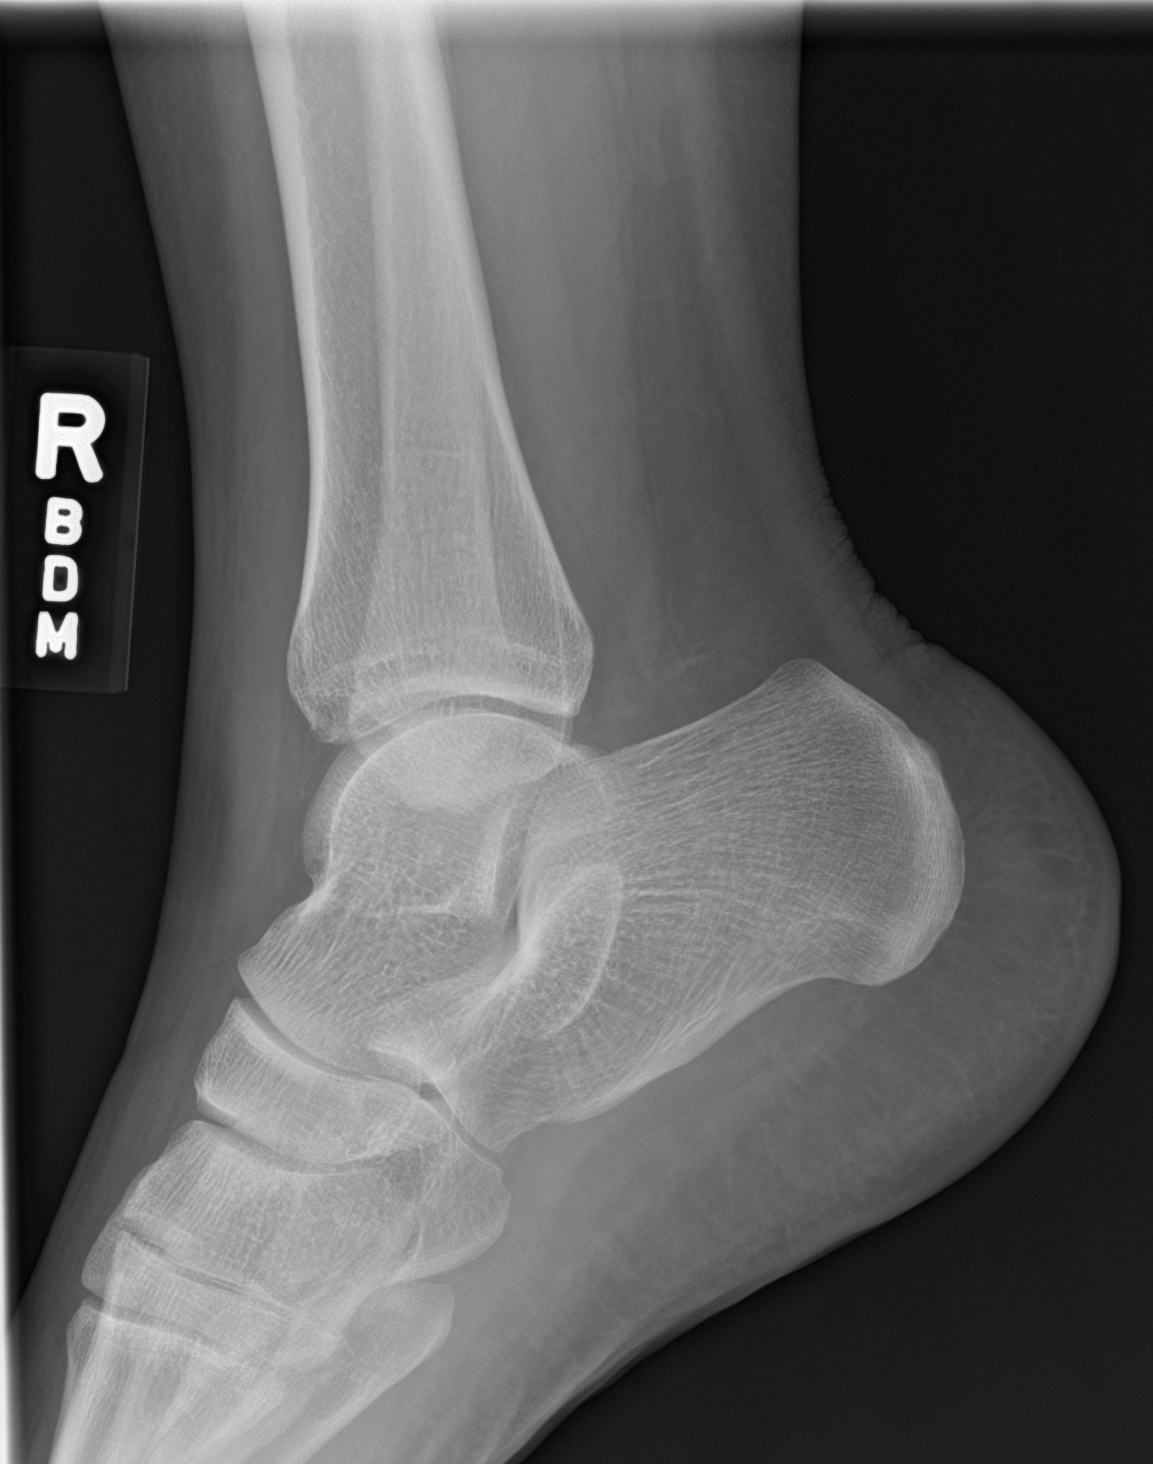

[3 of 3 positions shown; findings below may reference images not displayed]

FINDINGS: There is no evidence of fracture, dislocation, or joint effusion.
There is no evidence of arthropathy or other focal bone abnormality.
Soft tissues are unremarkable.
IMPRESSION: Negative.

## 2017-04-22 ENCOUNTER — Emergency Department (HOSPITAL_BASED_OUTPATIENT_CLINIC_OR_DEPARTMENT_OTHER)
Admission: EM | Admit: 2017-04-22 | Discharge: 2017-04-22 | Disposition: A | Discharge status: Home / Self Care | Payer: Self-pay | Attending: Emergency Medicine | Admitting: Emergency Medicine

## 2017-04-22 ENCOUNTER — Encounter (HOSPITAL_BASED_OUTPATIENT_CLINIC_OR_DEPARTMENT_OTHER): Payer: Self-pay | Admitting: *Deleted

## 2017-04-22 DIAGNOSIS — K0889 Other specified disorders of teeth and supporting structures: Secondary | ICD-10-CM | POA: Insufficient documentation

## 2017-04-22 MED ORDER — NAPROXEN 500 MG PO TABS: 500.0000 mg | ORAL_TABLET | Freq: Two times a day (BID) | ORAL | 0 refills | Status: DC

## 2017-04-22 MED ORDER — TRAMADOL HCL 50 MG PO TABS
50.0000 mg | ORAL_TABLET | Freq: Once | ORAL | Status: AC
  Administered 2017-04-22: 50 mg via ORAL
  Filled 2017-04-22: qty 1

## 2017-04-22 MED ORDER — TRAMADOL HCL 50 MG PO TABS: 50.0000 mg | ORAL_TABLET | Freq: Four times a day (QID) | ORAL | 0 refills | Status: DC | PRN

## 2017-04-22 MED ORDER — PENICILLIN V POTASSIUM 500 MG PO TABS: 500.0000 mg | ORAL_TABLET | Freq: Three times a day (TID) | ORAL | 0 refills | Status: DC

## 2017-04-22 NOTE — ED Triage Notes (Signed)
Pt reports R upper tooth pain since last night. Denies fever, n/v. Broken tooth visualized.

## 2017-04-22 NOTE — ED Notes (Signed)
ED Provider at bedside. 

## 2017-04-22 NOTE — ED Provider Notes (Signed)
[facility]   CSN: [id number] Arrival date & time: 04/22/17  1541  By signing my name below, I, Deland Pretty, attest that this documentation has been prepared under the direction and in the presence of Renne Crigler, PA-C. Electronically Signed: Deland Pretty, ED Scribe. 04/22/17. 5:50PM.   History   Chief Complaint Chief Complaint  Patient presents with  . Dental Pain    The history is provided by the patient. No language interpreter was used.   HPI Comments: Hayley Thompson is a 31 y.o. female who presents to the Emergency Department complaining of constant right lower dental pain that began last night and radiates to her right ear. She reports that she took tylenol extra strength pm last night with inadequate relief. The pt states that she does not have a dentist due to a lack of insurance. The pt denies fever, facial swelling, trouble swallowing, and SOB.  History reviewed. No pertinent past medical history.  There are no active problems to display for this patient.   History reviewed. No pertinent surgical history.  OB History    No data available       Home Medications    Prior to Admission medications   Medication Sig Start Date End Date Taking? Authorizing Provider  benzonatate (TESSALON) 100 MG capsule Take 1 capsule (100 mg total) by mouth every 8 (eight) hours. 11/13/16   Law, Waylan Boga, PA-C  ibuprofen (ADVIL,MOTRIN) 800 MG tablet Take 1 tablet (800 mg total) by mouth 3 (three) times daily. 11/13/16   Emi Holes, PA-C    Family History No family history on file.  Social History Social History  Substance Use Topics  . Smoking status: Never Smoker  . Smokeless tobacco: Never Used  . Alcohol use Yes     Comment: occ     Allergies   Patient has no known allergies.   Review of Systems Review of Systems  Constitutional: Negative for fever.  HENT: Positive for dental problem. Negative for ear pain, facial  swelling, sore throat and trouble swallowing.   Respiratory: Negative for shortness of breath and stridor.   Musculoskeletal: Negative for neck pain.  Skin: Negative for color change.  Neurological: Negative for headaches.     Physical Exam Updated Vital Signs BP 103/70 (BP Location: Left Arm)   Pulse 86   Temp 98.2 F (36.8 C) (Oral)   Resp 18   Ht 5\' 9"  (1.753 m)   Wt 209 lb (94.8 kg)   SpO2 98%   BMI 30.86 kg/m   Physical Exam  Constitutional: She appears well-developed and well-nourished.  HENT:  Head: Normocephalic.  Poor dentition, tooth #3 with large cavity, no palpable abscess or visible swelling.  Eyes: EOM are normal.  Neck: Normal range of motion.  Pulmonary/Chest: Effort normal.  Abdominal: She exhibits no distension.  Musculoskeletal: Normal range of motion.  Neurological: She is alert.  Psychiatric: She has a normal mood and affect.  Nursing note and vitals reviewed.    ED Treatments / Results   DIAGNOSTIC STUDIES: Oxygen Saturation is 98% on RA, normal by my interpretation.   COORDINATION OF CARE: 5:42 PM-Discussed next steps with pt including ibuprofen, tramadol, and penicillin (only if the symptoms of infection arise) for her symptoms. Pt verbalized understanding and is agreeable with the plan.    Labs (all labs ordered are listed, but only abnormal results are displayed) Labs Reviewed - No data to display  EKG  EKG Interpretation None  Radiology No results found.  Procedures Procedures (including critical care time)  Medications Ordered in ED Medications - No data to display   Initial Impression / Assessment and Plan / ED Course  I have reviewed the triage vital signs and the nursing notes.  Pertinent labs & imaging results that were available during my care of the patient were reviewed by me and considered in my medical decision making (see chart for details).     Vital signs reviewed and are as follows: Vitals:    04/22/17 1545 04/22/17 1801  BP: 103/70 109/76  Pulse: 86 65  Resp: 18 16  Temp: 98.2 F (36.8 C)       Final Clinical Impressions(s) / ED Diagnoses   Final diagnoses:  Toothache   Patient with toothache. No fever. Exam unconcerning for Ludwig's angina or other deep tissue infection in neck.   New Prescriptions Discharge Medication List as of 04/22/2017  6:19 PM    START taking these medications   Details  naproxen (NAPROSYN) 500 MG tablet Take 1 tablet (500 mg total) by mouth 2 (two) times daily., Starting Sun 04/22/2017, Print    penicillin v potassium (VEETID) 500 MG tablet Take 1 tablet (500 mg total) by mouth 3 (three) times daily., Starting Sun 04/22/2017, Print    traMADol (ULTRAM) 50 MG tablet Take 1 tablet (50 mg total) by mouth every 6 (six) hours as needed., Starting Sun 04/22/2017, Print       I personally performed the services described in this documentation, which was scribed in my presence. The recorded information has been reviewed and is accurate.     Renne CriglerGeiple, Jakeob Tullis, PA-C 04/22/17 2127    Alvira MondaySchlossman, Erin, MD 04/24/17 1316

## 2017-04-22 NOTE — Discharge Instructions (Signed)
Please read and follow all provided instructions.  Your diagnoses today include:  1. Toothache     The exam and treatment you received today has been provided on an emergency basis only. This is not a substitute for complete medical or dental care.  Tests performed today include:  Vital signs. See below for your results today.   Medications prescribed:   Penicillin - antibiotic  You have been prescribed an antibiotic medicine: take the entire course of medicine even if you are feeling better. Stopping early can cause the antibiotic not to work.   Tramadol - narcotic-like pain medication  DO NOT drive or perform any activities that require you to be awake and alert because this medicine can make you drowsy.   Take any prescribed medications only as directed.  Home care instructions:  Follow any educational materials contained in this packet.  Follow-up instructions: Please follow-up with your dentist for further evaluation of your symptoms.   Return instructions:   Please return to the Emergency Department if you experience worsening symptoms.  Please return if you develop a fever, you develop more swelling in your face or neck, you have trouble breathing or swallowing food.  Please return if you have any other emergent concerns.  Additional Information:  Your vital signs today were: BP 103/70 (BP Location: Left Arm)    Pulse 86    Temp 98.2 F (36.8 C) (Oral)    Resp 18    Ht 5\' 9"  (1.753 m)    Wt 94.8 kg (209 lb)    SpO2 98%    BMI 30.86 kg/m  If your blood pressure (BP) was elevated above 135/85 this visit, please have this repeated by your doctor within one month. --------------

## 2017-04-22 NOTE — ED Notes (Signed)
Pt c/o pain to right upper molar since last p.m. Hurts to chew and open mouth.

## 2017-12-12 ENCOUNTER — Emergency Department (HOSPITAL_BASED_OUTPATIENT_CLINIC_OR_DEPARTMENT_OTHER)
Admission: EM | Admit: 2017-12-12 | Discharge: 2017-12-12 | Disposition: A | Discharge status: Home / Self Care | Payer: Self-pay | Attending: Emergency Medicine | Admitting: Emergency Medicine

## 2017-12-12 ENCOUNTER — Encounter (HOSPITAL_BASED_OUTPATIENT_CLINIC_OR_DEPARTMENT_OTHER): Payer: Self-pay

## 2017-12-12 ENCOUNTER — Other Ambulatory Visit: Payer: Self-pay

## 2017-12-12 DIAGNOSIS — J029 Acute pharyngitis, unspecified: Secondary | ICD-10-CM | POA: Insufficient documentation

## 2017-12-12 DIAGNOSIS — B349 Viral infection, unspecified: Secondary | ICD-10-CM | POA: Insufficient documentation

## 2017-12-12 DIAGNOSIS — B9789 Other viral agents as the cause of diseases classified elsewhere: Secondary | ICD-10-CM

## 2017-12-12 DIAGNOSIS — J069 Acute upper respiratory infection, unspecified: Secondary | ICD-10-CM | POA: Insufficient documentation

## 2017-12-12 MED ORDER — BENZONATATE 100 MG PO CAPS: 100.0000 mg | ORAL_CAPSULE | Freq: Three times a day (TID) | ORAL | 0 refills | Status: AC

## 2017-12-12 MED ORDER — CHLORHEXIDINE GLUCONATE 0.12 % MT SOLN: 15.0000 mL | Freq: Two times a day (BID) | OROMUCOSAL | 0 refills | Status: AC

## 2017-12-12 NOTE — ED Triage Notes (Signed)
C/o flu like sx day 3-NAD-steady gait 

## 2017-12-12 NOTE — ED Provider Notes (Signed)
[facility] EMERGENCY DEPARTMENT Provider Note   CSN: [id number] Arrival date & time: 12/12/17  1955     History   Chief Complaint Chief Complaint  Patient presents with  . Cough    HPI Hayley Thompson is a 32 y.o. female.  HPI   32 year old female without any significant past medical history presenting with cold symptoms.  For the past 3 days patient has had occasional chills, runny nose, sore throat, nonproductive cough.  Symptoms worse at nighttime.  No ear pain, severe headache, shortness of breath, abdominal cramping, nausea vomiting or diarrhea.  No significant treatment tried.  No fever.  She did not had her flu shot.  History reviewed. No pertinent past medical history.  There are no active problems to display for this patient.   History reviewed. No pertinent surgical history.  OB History    No data available       Home Medications    Prior to Admission medications   Not on File    Family History No family history on file.  Social History Social History   Tobacco Use  . Smoking status: Never Smoker  . Smokeless tobacco: Never Used  Substance Use Topics  . Alcohol use: Yes    Comment: occ  . Drug use: No     Allergies   Patient has no known allergies.   Review of Systems Review of Systems  All other systems reviewed and are negative.    Physical Exam Updated Vital Signs BP 126/80 (BP Location: Left Arm)   Pulse 75   Temp 99 F (37.2 C) (Oral)   Resp 18   Ht 5\' 9"  (1.753 m)   Wt 94.6 kg (208 lb 8.9 oz)   SpO2 99%   BMI 30.80 kg/m   Physical Exam  Constitutional: She appears well-developed and well-nourished. No distress.  HENT:  Head: Atraumatic.  Ears: TMs normal bilaterally  Chest nose: Normal nares Throat: Uvula not visible mild post oropharyngeal erythema, no tonsillar enlargement or exudates, no trismus  Eyes: Conjunctivae are normal.  Neck: Normal range of motion. Neck supple.  No nuchal rigidity    Cardiovascular: Normal rate and regular rhythm.  Pulmonary/Chest: Effort normal and breath sounds normal.  Abdominal: Soft. She exhibits no distension. There is no tenderness.  Lymphadenopathy:    She has no cervical adenopathy.  Neurological: She is alert.  Skin: No rash noted.  Psychiatric: She has a normal mood and affect.  Nursing note and vitals reviewed.    ED Treatments / Results  Labs (all labs ordered are listed, but only abnormal results are displayed) Labs Reviewed - No data to display  EKG  EKG Interpretation None       Radiology No results found.  Procedures Procedures (including critical care time)  Medications Ordered in ED Medications - No data to display   Initial Impression / Assessment and Plan / ED Course  I have reviewed the triage vital signs and the nursing notes.  Pertinent labs & imaging results that were available during my care of the patient were reviewed by me and considered in my medical decision making (see chart for details).     BP 126/80 (BP Location: Left Arm)   Pulse 75   Temp 99 F (37.2 C) (Oral)   Resp 18   Ht 5\' 9"  (1.753 m)   Wt 94.6 kg (208 lb 8.9 oz)   SpO2 99%   BMI 30.80 kg/m    Final Clinical Impressions(s) /  ED Diagnoses   Final diagnoses:  Viral URI with cough  Pharyngitis, unspecified etiology    ED Discharge Orders        Ordered    benzonatate (TESSALON) 100 MG capsule  Every 8 hours     12/12/17 2138    chlorhexidine (PERIDEX) 0.12 % solution  2 times daily     12/12/17 2138     Pt symptoms consistent with URI. Throat exam unremarkable, no evidence ot PTA or deep tissue infection.  Low suspicion for strep at this time.  Pt will be discharged with symptomatic treatment.  Discussed return precautions.  Pt is hemodynamically stable & in NAD prior to discharge.    Fayrene Helper, PA-C 12/12/17 2139    Nira Conn, MD 12/13/17 [phone]

## 2018-01-30 ENCOUNTER — Other Ambulatory Visit: Payer: Self-pay

## 2018-01-30 ENCOUNTER — Encounter (HOSPITAL_BASED_OUTPATIENT_CLINIC_OR_DEPARTMENT_OTHER): Payer: Self-pay | Admitting: *Deleted

## 2018-01-30 ENCOUNTER — Emergency Department (HOSPITAL_BASED_OUTPATIENT_CLINIC_OR_DEPARTMENT_OTHER)
Admission: EM | Admit: 2018-01-30 | Discharge: 2018-01-30 | Disposition: A | Discharge status: Home / Self Care | Payer: Self-pay | Attending: Emergency Medicine | Admitting: Emergency Medicine

## 2018-01-30 DIAGNOSIS — J301 Allergic rhinitis due to pollen: Secondary | ICD-10-CM | POA: Insufficient documentation

## 2018-01-30 DIAGNOSIS — J3489 Other specified disorders of nose and nasal sinuses: Secondary | ICD-10-CM | POA: Insufficient documentation

## 2018-01-30 DIAGNOSIS — Z79899 Other long term (current) drug therapy: Secondary | ICD-10-CM | POA: Insufficient documentation

## 2018-01-30 DIAGNOSIS — R067 Sneezing: Secondary | ICD-10-CM | POA: Insufficient documentation

## 2018-01-30 DIAGNOSIS — H1045 Other chronic allergic conjunctivitis: Secondary | ICD-10-CM | POA: Insufficient documentation

## 2018-01-30 DIAGNOSIS — H1013 Acute atopic conjunctivitis, bilateral: Secondary | ICD-10-CM

## 2018-01-30 MED ORDER — FLUTICASONE PROPIONATE 50 MCG/ACT NA SUSP: 2.0000 | Freq: Every day | NASAL | 0 refills | Status: AC

## 2018-01-30 MED ORDER — OLOPATADINE HCL 0.1 % OP SOLN: 1.0000 [drp] | Freq: Two times a day (BID) | OPHTHALMIC | 0 refills | Status: AC

## 2018-01-30 MED ORDER — LEVOCETIRIZINE DIHYDROCHLORIDE 5 MG PO TABS: 5.0000 mg | ORAL_TABLET | Freq: Every evening | ORAL | 0 refills | Status: AC

## 2018-01-30 NOTE — ED Provider Notes (Signed)
[facility] EMERGENCY DEPARTMENT Provider Note   CSN: [id number] Arrival date & time: 01/30/18  1906     History   Chief Complaint Chief Complaint  Patient presents with  . URI    HPI Hayley Thompson is a 32 y.o. female with a PMHx of seasonal allergies, who presents to the ED with complaints of her "allergies acting up".  Patient states that for the last 2 weeks she has had nasal congestion, rhinorrhea, sneezing, itchy watery eyes, and eye redness.  She has been trying Zyrtec with no relief of her symptoms.  Being outside seems to aggravate her symptoms.  She states that she has seasonal allergies and usually has similar symptoms during the season changes, but this year seems to be worse than others.  She does not wear contact lenses, has not recently been swimming, and has not had any injury or foreign bodies to the eyes.  No known sick contacts or exposure to similar symptoms.  She denies any purulent eye drainage or crusting, vision changes, ear pain or drainage, sore throat, cough, wheezing, fevers, chills, CP, SOB, abd pain, N/V/D/C, hematuria, dysuria, myalgias, arthralgias, numbness, tingling, focal weakness, rashes, or any other complaints at this time.   The history is provided by the patient and medical records. No language interpreter was used.  Eye Problem   This is a new problem. The current episode started more than 1 week ago. The problem occurs constantly. The problem has not changed since onset.There is a problem in both eyes. There was no injury mechanism. The pain is at a severity of 0/10. The patient is experiencing no pain. There is no history of trauma to the eye. There is no known exposure to pink eye. She does not wear contacts. Associated symptoms include discharge (watery, no purulent drainage), eye redness and itching. Pertinent negatives include no numbness, no blurred vision, no decreased vision, no double vision, no nausea, no vomiting and no weakness.  Treatments tried: zyrtec. The treatment provided no relief.    History reviewed. No pertinent past medical history.  There are no active problems to display for this patient.   History reviewed. No pertinent surgical history.   OB History   None      Home Medications    Prior to Admission medications   Medication Sig Start Date End Date Taking? Authorizing Provider  cetirizine (ZYRTEC) 10 MG tablet Take 10 mg by mouth daily.   Yes [provider]  benzonatate (TESSALON) 100 MG capsule Take 1 capsule (100 mg total) by mouth every 8 (eight) hours. 12/12/17   Fayrene Helper, PA-C  chlorhexidine (PERIDEX) 0.12 % solution Use as directed 15 mLs in the mouth or throat 2 (two) times daily. 12/12/17   Fayrene Helper, PA-C    Family History History reviewed. No pertinent family history.  Social History Social History   Tobacco Use  . Smoking status: Never Smoker  . Smokeless tobacco: Never Used  Substance Use Topics  . Alcohol use: Yes    Comment: occ  . Drug use: No     Allergies   Patient has no known allergies.   Review of Systems Review of Systems  Constitutional: Negative for chills and fever.  HENT: Positive for congestion, rhinorrhea and sneezing. Negative for ear discharge, ear pain and sore throat.   Eyes: Positive for discharge (watery, no purulent drainage), redness and itching. Negative for blurred vision, double vision and visual disturbance.  Respiratory: Negative for cough, shortness of breath  and wheezing.   Cardiovascular: Negative for chest pain.  Gastrointestinal: Negative for abdominal pain, constipation, diarrhea, nausea and vomiting.  Genitourinary: Negative for dysuria and hematuria.  Musculoskeletal: Negative for arthralgias and myalgias.  Skin: Positive for itching. Negative for rash.  Allergic/Immunologic: Positive for environmental allergies. Negative for immunocompromised state.  Neurological: Negative for weakness and numbness.    Psychiatric/Behavioral: Negative for confusion.   All other systems reviewed and are negative for acute change except as noted in the HPI.    Physical Exam Updated Vital Signs BP 133/87   Pulse 91   Temp 99.1 F (37.3 C) (Oral)   Resp 16   SpO2 97%   Physical Exam  Constitutional: She is oriented to person, place, and time. Vital signs are normal. She appears well-developed and well-nourished.  Non-toxic appearance. No distress.  Afebrile, nontoxic, NAD  HENT:  Head: Normocephalic and atraumatic.  Nose: Mucosal edema and rhinorrhea present.  Mouth/Throat: Uvula is midline, oropharynx is clear and moist and mucous membranes are normal. No trismus in the jaw. No uvula swelling. Tonsils are 0 on the right. Tonsils are 0 on the left. No tonsillar exudate.  Nose moderately congested and with clear rhinorrhea. Oropharynx clear and moist, without uvular swelling or deviation, no trismus or drooling, no tonsillar swelling or erythema, no exudates.    Eyes: Pupils are equal, round, and reactive to light. EOM are normal. Right eye exhibits no discharge. Left eye exhibits no discharge. Right conjunctiva is injected. Left conjunctiva is injected.  PERRL, EOMI, no nystagmus. B/l eyes with mild conjunctival injection, no ocular drainage, no photophobia or consensual eye pain.  Neck: Normal range of motion. Neck supple.  Cardiovascular: Normal rate and intact distal pulses.  Pulmonary/Chest: Effort normal. No respiratory distress.  Abdominal: Normal appearance. She exhibits no distension.  Musculoskeletal: Normal range of motion.  Neurological: She is alert and oriented to person, place, and time. She has normal strength. No sensory deficit.  Skin: Skin is warm, dry and intact. No rash noted.  Psychiatric: She has a normal mood and affect. Her behavior is normal.  Nursing note and vitals reviewed.    ED Treatments / Results  Labs (all labs ordered are listed, but only abnormal results are  displayed) Labs Reviewed - No data to display  EKG None  Radiology No results found.  Procedures Procedures (including critical care time)  Medications Ordered in ED Medications - No data to display   Initial Impression / Assessment and Plan / ED Course  I have reviewed the triage vital signs and the nursing notes.  Pertinent labs & imaging results that were available during my care of the patient were reviewed by me and considered in my medical decision making (see chart for details).     32 y.o. female here with allergy symptoms x2wks, including b/l eye itching/watering, nasal congestion/rhinorrhea, and sneezing. Has seasonal allergies but this year seems to be worse. On exam, b/l conjunctiva mildly injected, no ocular drainage, no photophobia or consensual eye pain, EOMI, PERRL; moderate nasal congestion and clear rhinorrhea; throat clear. Symptoms consistent with allergic conjunctivitis and rhinitis. Doubt bacterial or viral etiology of her symptoms/conjunctivitis. Will start on flonase, xyzal, and olopatadine drops. Advised other OTC remedies for symptomatic relief. F/up with PCP in 1wk for recheck of symptoms. I explained the diagnosis and have given explicit precautions to return to the ER including for any other new or worsening symptoms. The patient understands and accepts the medical plan as it's been dictated  and I have answered their questions. Discharge instructions concerning home care and prescriptions have been given. The patient is STABLE and is discharged to home in good condition.    Final Clinical Impressions(s) / ED Diagnoses   Final diagnoses:  Seasonal allergic rhinitis due to pollen  Allergic conjunctivitis of both eyes    ED Discharge Orders        Ordered    fluticasone (FLONASE) 50 MCG/ACT nasal spray  Daily     01/30/18 1924    levocetirizine (XYZAL) 5 MG tablet  Every evening     01/30/18 1924    olopatadine (PATANOL) 0.1 % ophthalmic solution  2  times daily     01/30/18 [address]1924       Samiha Denapoli, DentonMercedes, [place] JerseyPA-C 01/30/18 1931    Tegeler, Canary Brimhristopher J, MD 01/31/18 [phone]

## 2018-01-30 NOTE — ED Triage Notes (Signed)
Pt c/o URI aytmpoms x 2 weeks

## 2018-01-30 NOTE — Discharge Instructions (Signed)
Continue to stay well-hydrated. Continue to alternate between Tylenol and Ibuprofen for pain or fever. Use Mucinex for cough suppression/expectoration of mucus. Use netipot and flonase to help with nasal congestion. Use the antihistamine xyzal to decrease secretions and for help with your symptoms. Try to avoid rubbing eyes. Apply cool compresses and use prescribed eye drops as directed. Follow up with your primary care doctor in 5-7 days for recheck of ongoing symptoms. Return to emergency department for emergent changing or worsening of symptoms.
